# Patient Record
Sex: Female | Born: 1965 | Race: White | Hispanic: No | Marital: Married | Smoking: Never smoker
Health system: Southern US, Community
[De-identification: ages and names within clinical notes are randomized; demographics above are authoritative.]

## PROBLEM LIST (undated history)

## (undated) DIAGNOSIS — E079 Disorder of thyroid, unspecified: Secondary | ICD-10-CM

## (undated) HISTORY — PX: TUBAL LIGATION: SHX77

## (undated) HISTORY — PX: OTHER SURGICAL HISTORY: SHX169

---

## 2007-06-18 ENCOUNTER — Ambulatory Visit: Payer: Self-pay | Admitting: Gastroenterology

## 2008-08-13 ENCOUNTER — Ambulatory Visit: Payer: Self-pay | Admitting: Obstetrics and Gynecology

## 2008-10-09 ENCOUNTER — Ambulatory Visit: Payer: Self-pay | Admitting: Obstetrics and Gynecology

## 2011-03-04 ENCOUNTER — Ambulatory Visit: Payer: Self-pay

## 2011-03-04 LAB — RAPID STREP-A WITH REFLX: Micro Text Report: NEGATIVE

## 2011-05-21 IMAGING — US ULTRASOUND RIGHT BREAST
1 series · 17 of 20 positions shown · non-contrast
Comparison: None.

REASON FOR EXAM: right breast asymmetry Hilifavali [id number]
[id number]
COMMENTS:

PROCEDURE:     US  - US BREAST RIGHT  - October 09, 2008 [DATE]
RESULT:

[Series 1: ultrasound right breast · 17 of 20 slices shown]
[im 1/20]
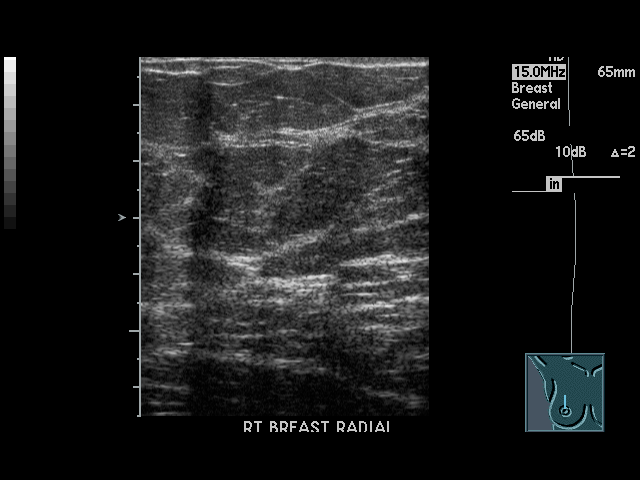
[im 2/20]
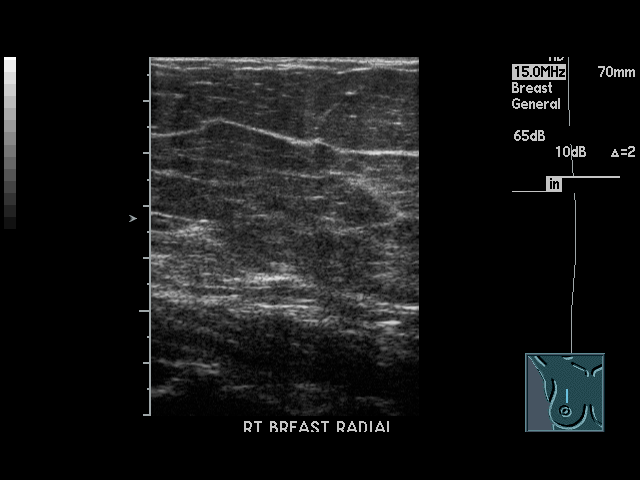
[im 3/20]
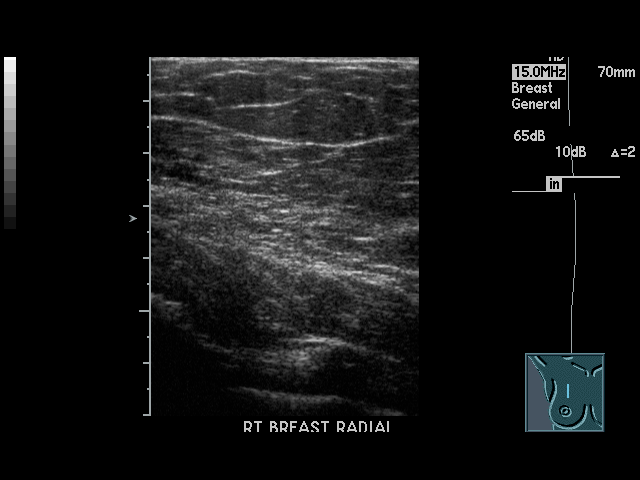
[im 5/20]
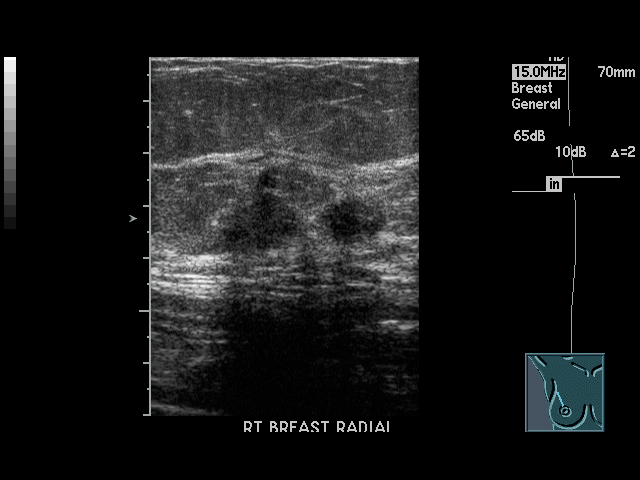
[im 6/20]
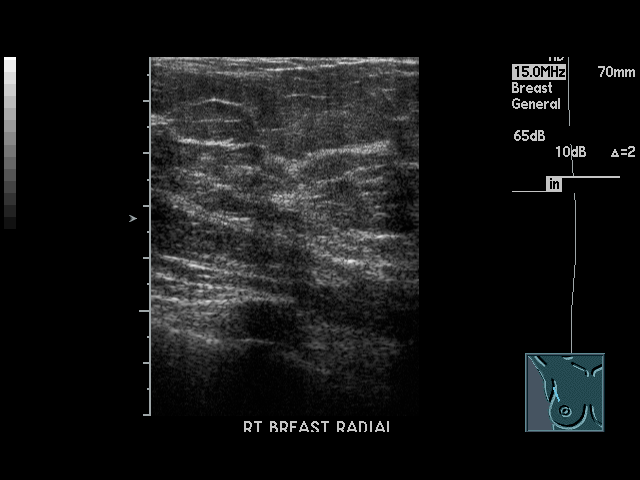
[im 7/20]
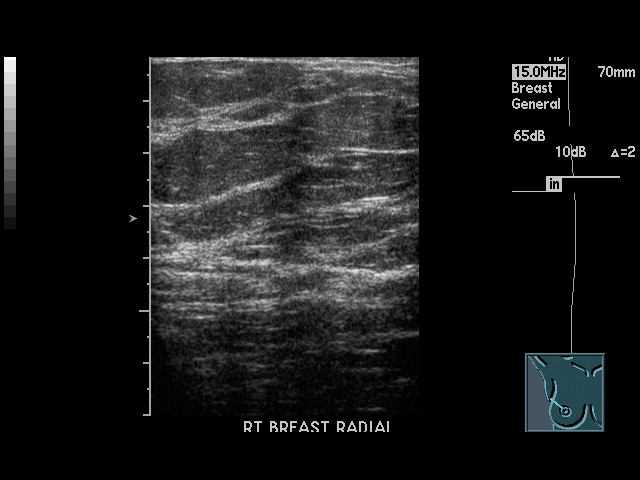
[im 8/20]
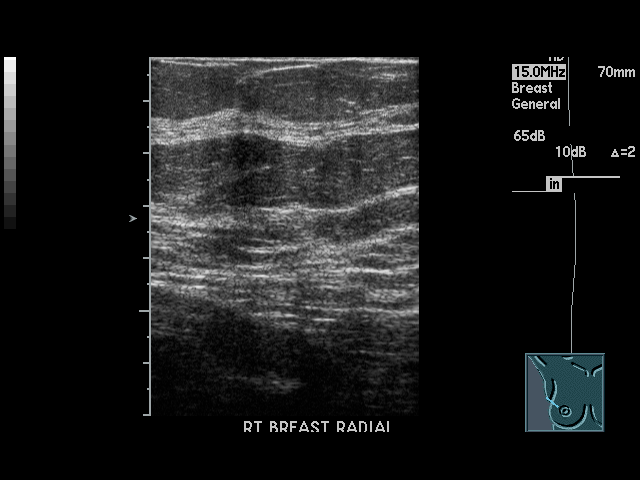
[im 9/20]
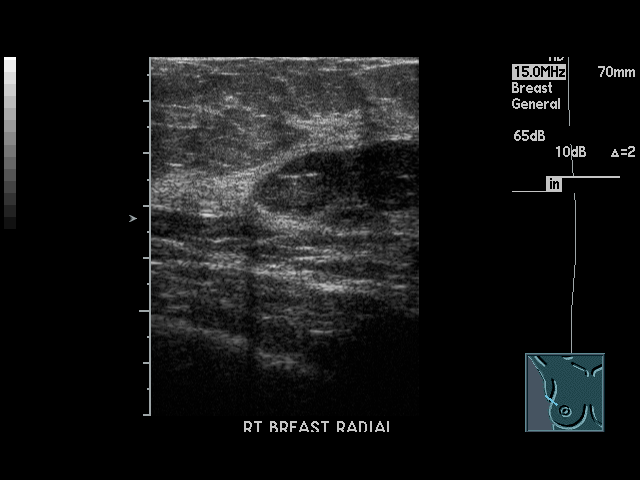
[im 11/20]
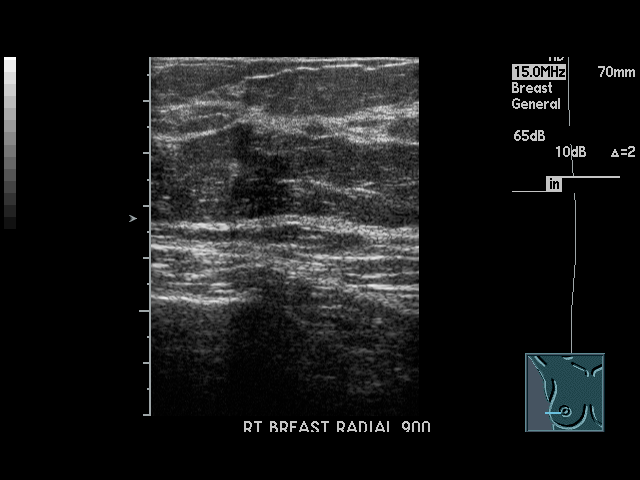
[im 12/20]
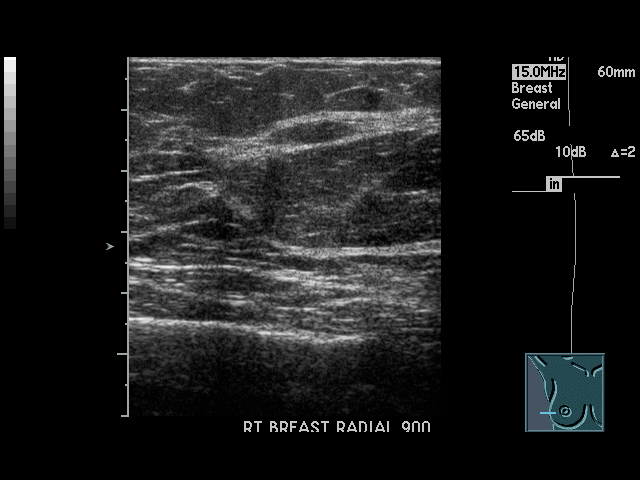
[im 13/20]
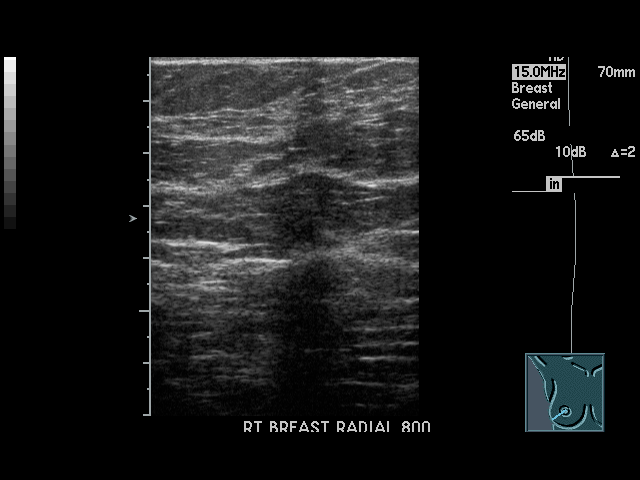
[im 14/20]
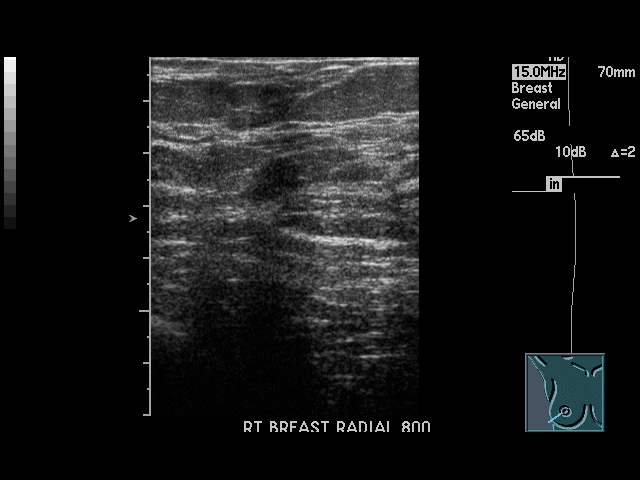
[im 15/20]
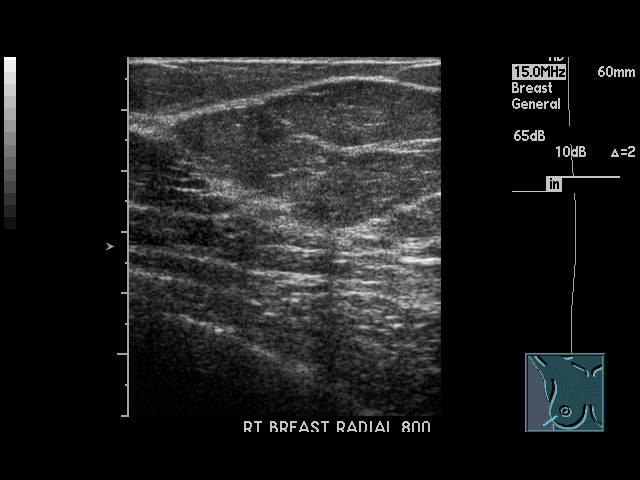
[im 16/20]
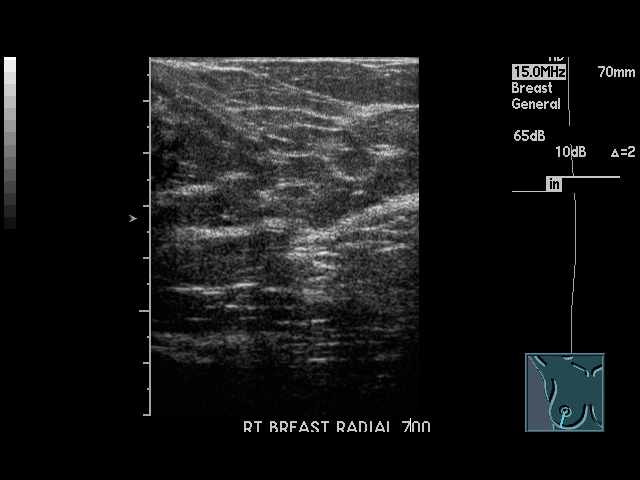
[im 18/20]
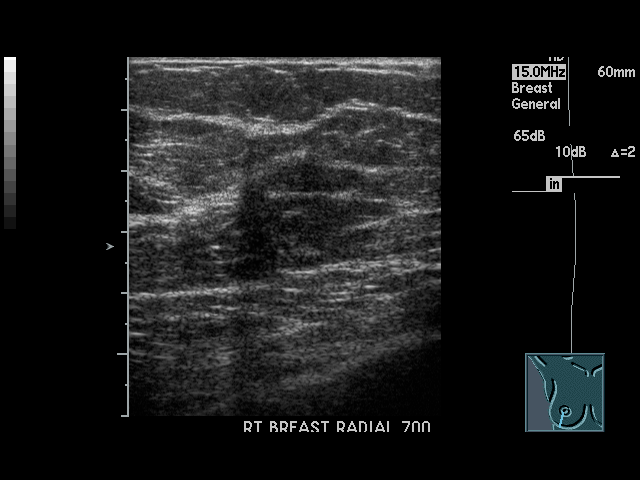
[im 19/20]
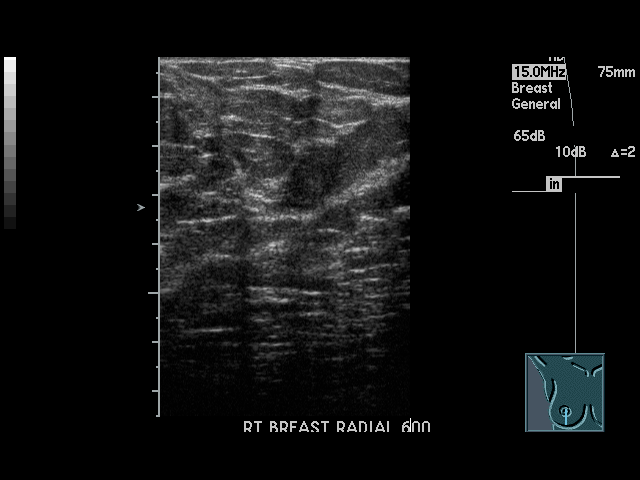
[im 20/20]
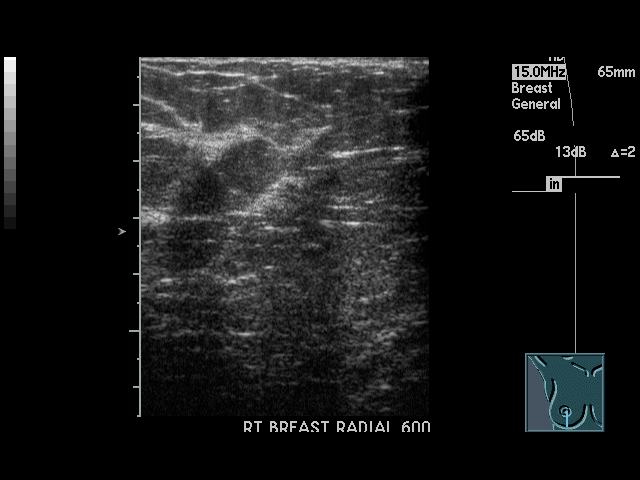

[17 of 20 positions shown; findings below may reference images not displayed]

FINDINGS: True lateral view of the right breast and spot compression views
of the upper outer right breast were performed.  The area of concern spreads
out and partially blends in with adjacent fibroglandular tissue.  There is
no dominant mass. There is no cluster of suspicious microcalcifications.

On the true lateral view there is no focal abnormality.

Further evaluation was performed with real time sonography of the lateral
half of the right breast.  There is no solid or cystic mass visualized.
There is no architectural distortion.
IMPRESSION: 1.Previously described focal asymmetry in the upper outer right breast
demonstrates no sonographic abnormality.  On spot compression views the area
compresses and partially blends in with normal adjacent fibroglandular
tissue.  No further evaluation is recommended. Return to annual mammographic
follow-up is recommended.

BI-RADS: Category 2 - Benign Findings

## 2013-10-24 ENCOUNTER — Emergency Department: Payer: Self-pay | Admitting: Emergency Medicine

## 2014-05-03 ENCOUNTER — Ambulatory Visit
Admit: 2014-05-03 | Disposition: A | Discharge status: Home / Self Care | Payer: Self-pay | Attending: Family Medicine | Admitting: Family Medicine

## 2014-05-03 LAB — URINALYSIS, COMPLETE
Bacteria: NEGATIVE
Bilirubin,UR: NEGATIVE
Blood: NEGATIVE
Glucose,UR: NEGATIVE
Ketone: NEGATIVE
LEUKOCYTE ESTERASE: NEGATIVE
Nitrite: NEGATIVE
PH: 5.5 (ref 5.0–8.0)
PROTEIN: NEGATIVE
Specific Gravity: 1.005 (ref 1.000–1.030)
WBC UR: NONE SEEN /HPF (ref 0–5)

## 2014-05-05 LAB — URINE CULTURE

## 2016-06-04 IMAGING — CR DG FOOT COMPLETE 3+V*L*
1 series · 3 of 3 positions shown · non-contrast
Comparison: None.

CLINICAL DATA: Stepped on a nail. Foot tenderness. Initial
encounter.

EXAM:
LEFT FOOT - COMPLETE 3+ VIEW

[Series 1: x foot ap left · 0.14mm/px · 3 of 3 slices shown]
[im 1/3]
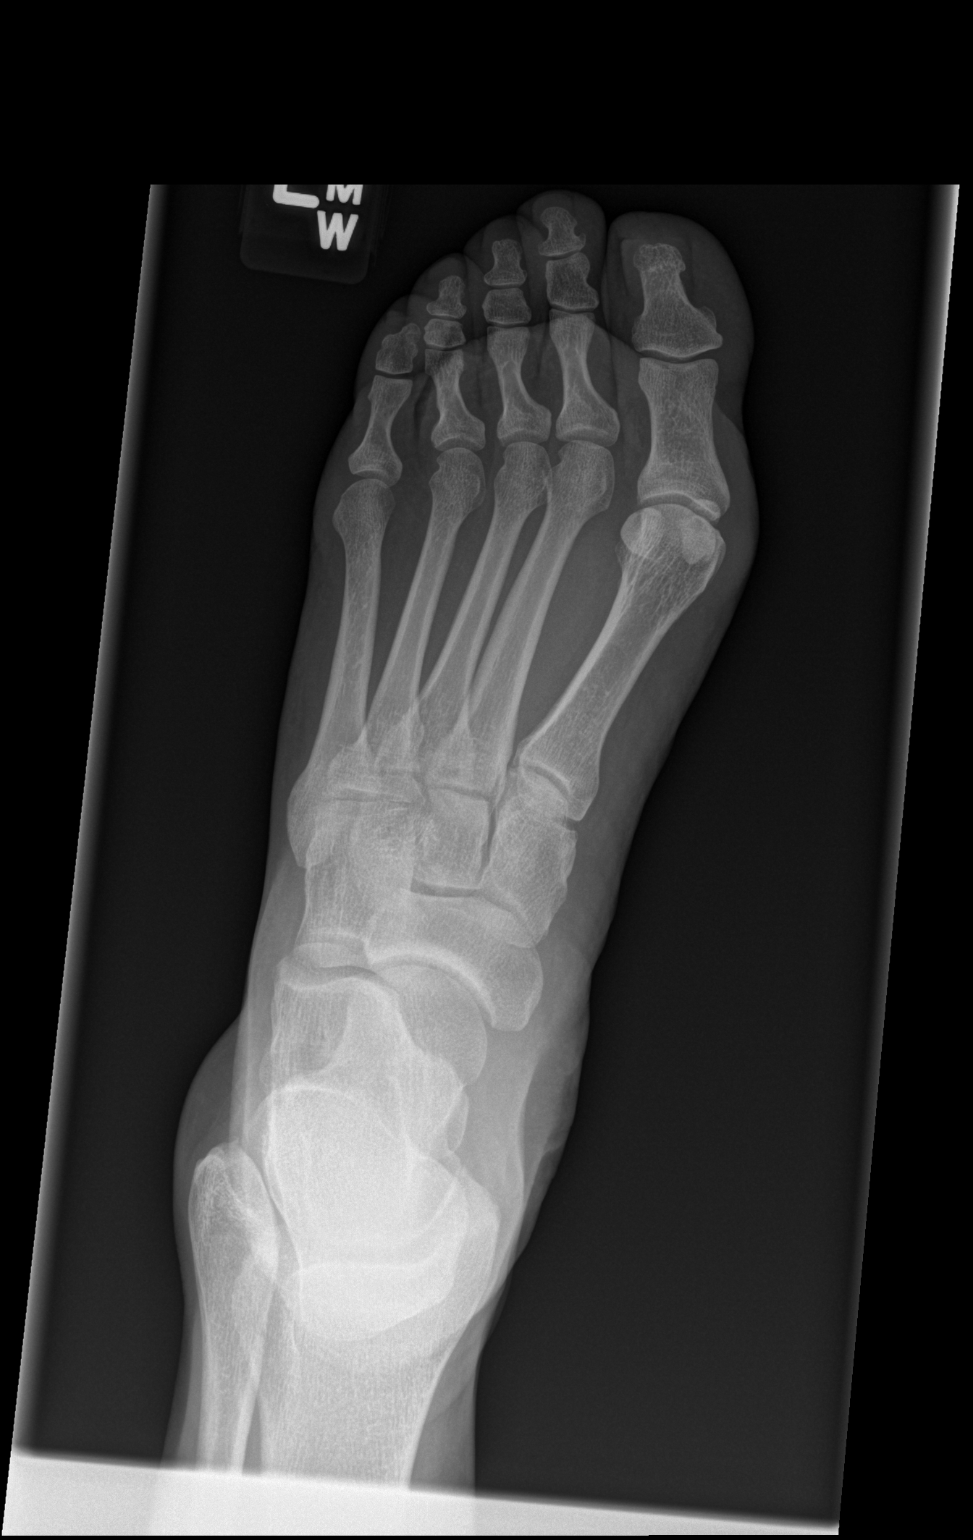
[im 2/3]
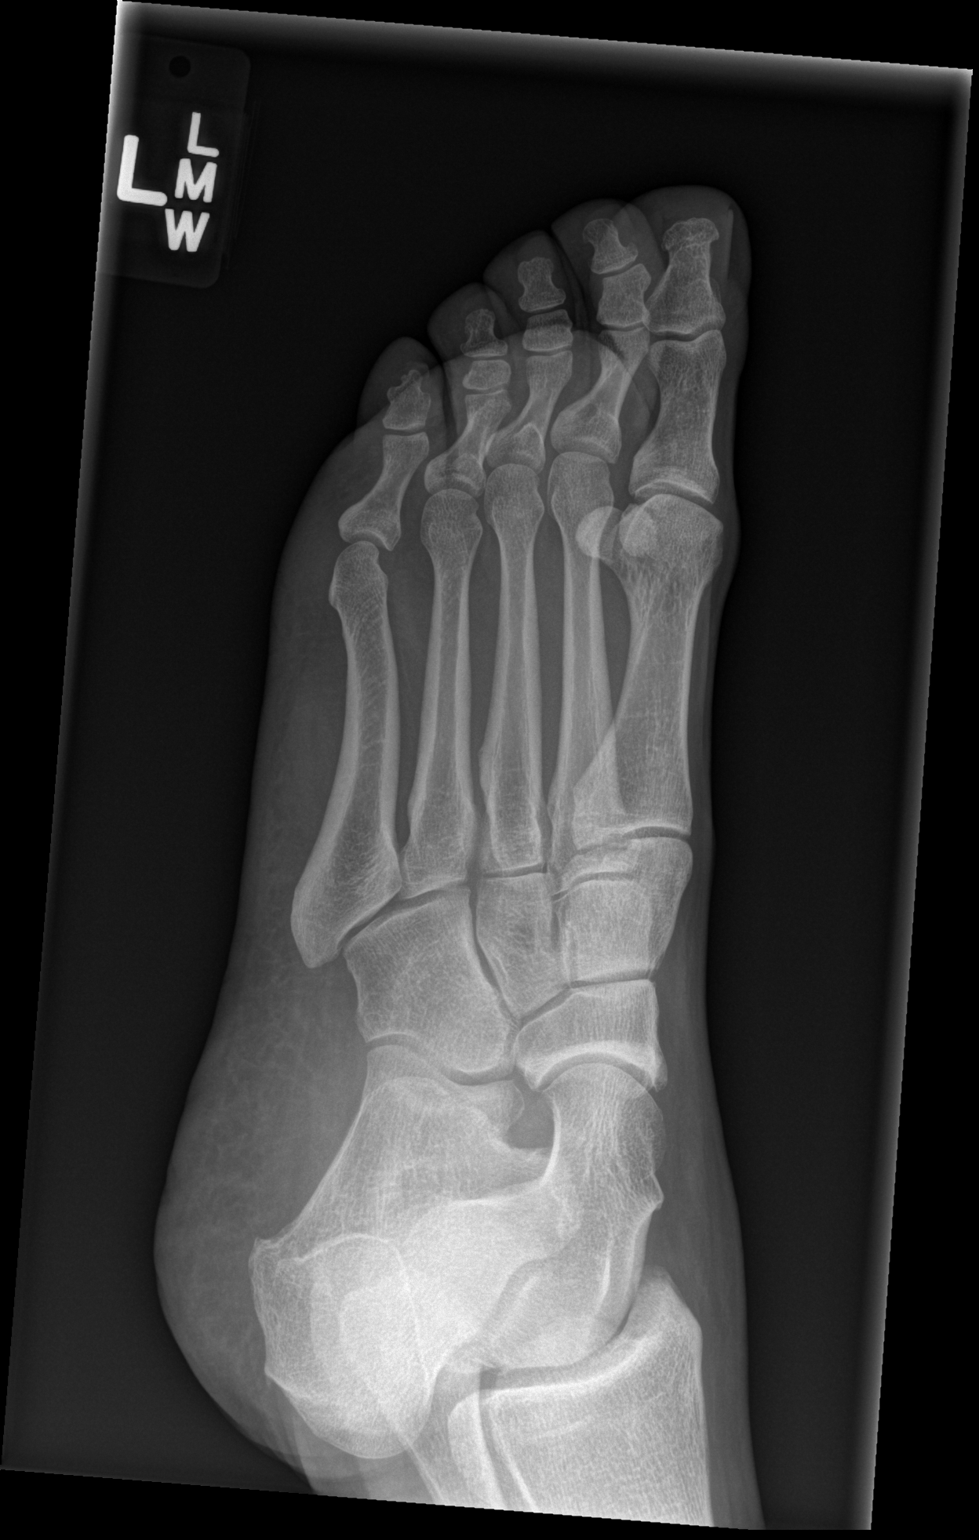
[im 3/3]
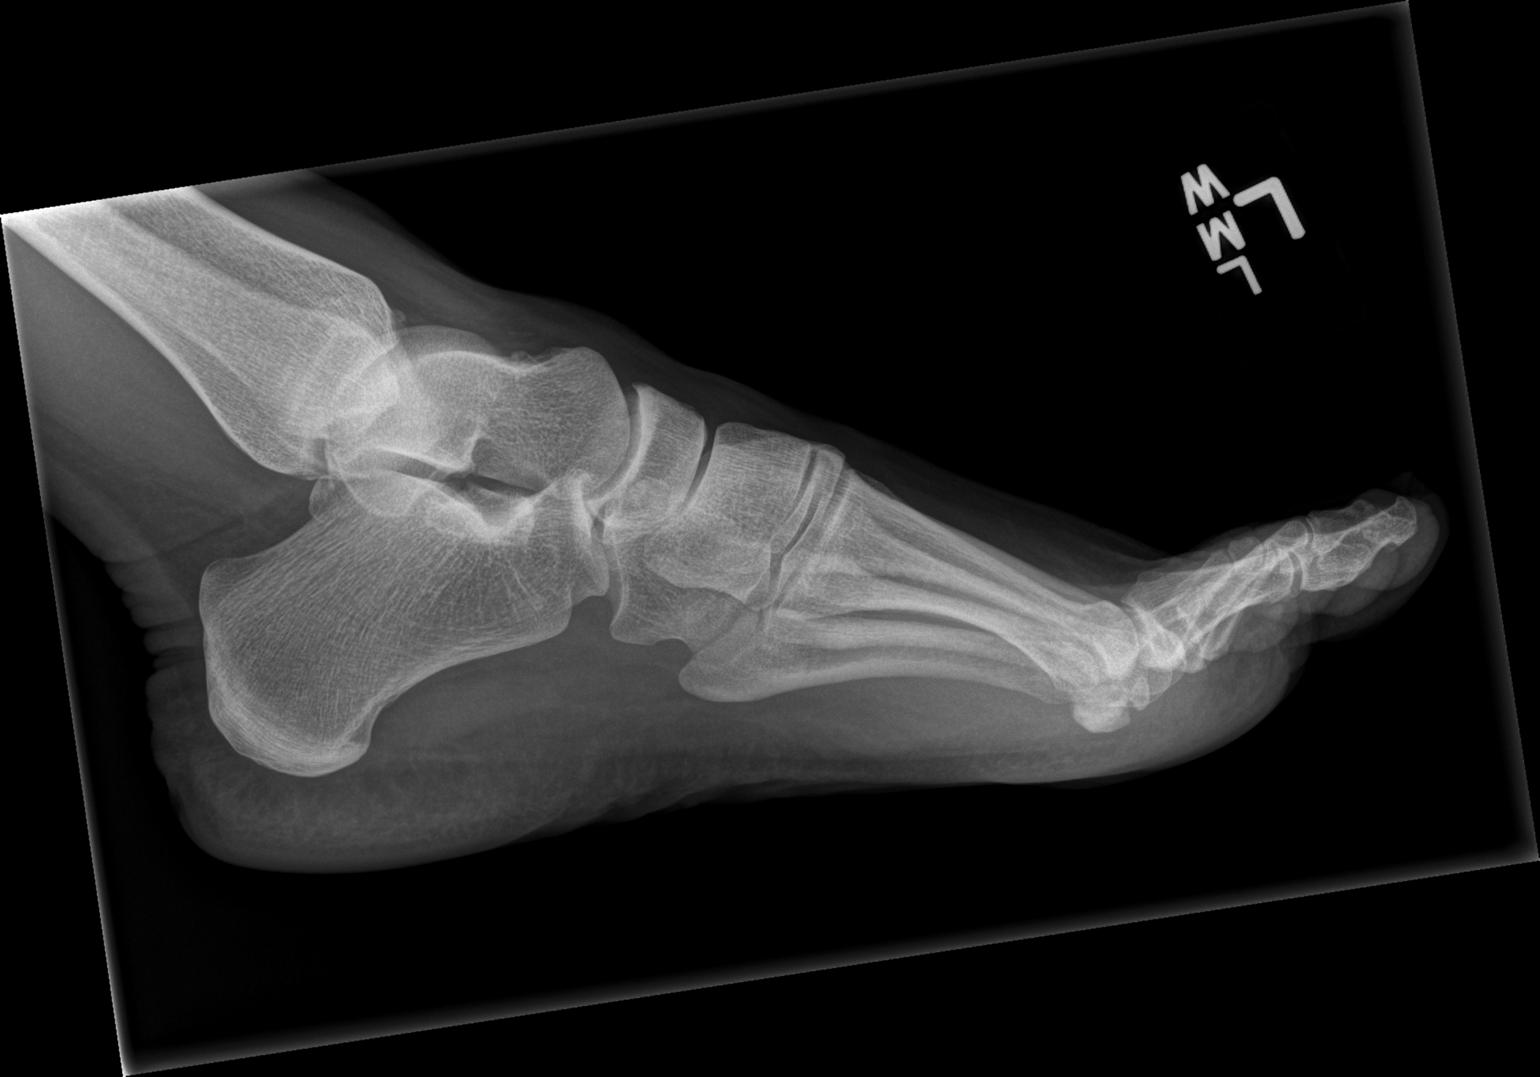

[3 of 3 positions shown; findings below may reference images not displayed]

FINDINGS: There is no evidence of fracture or dislocation. No metallic foreign
body. No subcutaneous gas.
IMPRESSION: No fracture or metallic foreign body.

## 2016-12-22 ENCOUNTER — Other Ambulatory Visit: Payer: Self-pay | Admitting: Obstetrics and Gynecology

## 2016-12-22 DIAGNOSIS — Z1231 Encounter for screening mammogram for malignant neoplasm of breast: Secondary | ICD-10-CM

## 2017-01-18 ENCOUNTER — Inpatient Hospital Stay: Admission: RE | Admit: 2017-01-18 | Payer: Self-pay | Source: Ambulatory Visit

## 2017-11-29 ENCOUNTER — Ambulatory Visit
Admission: EM | Admit: 2017-11-29 | Discharge: 2017-11-29 | Disposition: A | Discharge status: Home / Self Care | Payer: BC Managed Care – PPO | Attending: Family Medicine | Admitting: Family Medicine

## 2017-11-29 ENCOUNTER — Other Ambulatory Visit: Payer: Self-pay

## 2017-11-29 ENCOUNTER — Ambulatory Visit (INDEPENDENT_AMBULATORY_CARE_PROVIDER_SITE_OTHER)
Admit: 2017-11-29 | Discharge: 2017-11-29 | Disposition: A | Discharge status: Home / Self Care | Payer: BC Managed Care – PPO | Attending: Emergency Medicine | Admitting: Emergency Medicine

## 2017-11-29 DIAGNOSIS — R110 Nausea: Secondary | ICD-10-CM | POA: Diagnosis not present

## 2017-11-29 DIAGNOSIS — M5416 Radiculopathy, lumbar region: Secondary | ICD-10-CM | POA: Insufficient documentation

## 2017-11-29 DIAGNOSIS — K760 Fatty (change of) liver, not elsewhere classified: Secondary | ICD-10-CM | POA: Diagnosis not present

## 2017-11-29 DIAGNOSIS — R194 Change in bowel habit: Secondary | ICD-10-CM | POA: Diagnosis not present

## 2017-11-29 DIAGNOSIS — R208 Other disturbances of skin sensation: Secondary | ICD-10-CM | POA: Diagnosis not present

## 2017-11-29 DIAGNOSIS — E079 Disorder of thyroid, unspecified: Secondary | ICD-10-CM | POA: Diagnosis not present

## 2017-11-29 DIAGNOSIS — R1032 Left lower quadrant pain: Secondary | ICD-10-CM | POA: Insufficient documentation

## 2017-11-29 DIAGNOSIS — R109 Unspecified abdominal pain: Secondary | ICD-10-CM | POA: Diagnosis not present

## 2017-11-29 DIAGNOSIS — R509 Fever, unspecified: Secondary | ICD-10-CM

## 2017-11-29 DIAGNOSIS — M545 Low back pain: Secondary | ICD-10-CM | POA: Diagnosis present

## 2017-11-29 HISTORY — DX: Disorder of thyroid, unspecified: E07.9

## 2017-11-29 LAB — CBC WITH DIFFERENTIAL/PLATELET
Abs Immature Granulocytes: 0.02 10*3/uL (ref 0.00–0.07)
Basophils Absolute: 0.1 10*3/uL (ref 0.0–0.1)
Basophils Relative: 1 %
Eosinophils Absolute: 0.1 10*3/uL (ref 0.0–0.5)
Eosinophils Relative: 1 %
HCT: 41.3 % (ref 36.0–46.0)
Hemoglobin: 13.7 g/dL (ref 12.0–15.0)
Immature Granulocytes: 0 %
Lymphocytes Relative: 35 %
Lymphs Abs: 2.8 10*3/uL (ref 0.7–4.0)
MCH: 31.7 pg (ref 26.0–34.0)
MCHC: 33.2 g/dL (ref 30.0–36.0)
MCV: 95.6 fL (ref 80.0–100.0)
Monocytes Absolute: 0.8 10*3/uL (ref 0.1–1.0)
Monocytes Relative: 10 %
Neutro Abs: 4.3 10*3/uL (ref 1.7–7.7)
Neutrophils Relative %: 53 %
Platelets: 322 10*3/uL (ref 150–400)
RBC: 4.32 MIL/uL (ref 3.87–5.11)
RDW: 13.1 % (ref 11.5–15.5)
WBC: 8 10*3/uL (ref 4.0–10.5)
nRBC: 0 % (ref 0.0–0.2)

## 2017-11-29 LAB — URINALYSIS, COMPLETE (UACMP) WITH MICROSCOPIC
BACTERIA UA: NONE SEEN
BILIRUBIN URINE: NEGATIVE
GLUCOSE, UA: NEGATIVE mg/dL
KETONES UR: NEGATIVE mg/dL
LEUKOCYTES UA: NEGATIVE
Nitrite: NEGATIVE
PH: 5.5 (ref 5.0–8.0)
Protein, ur: NEGATIVE mg/dL
Specific Gravity, Urine: 1.025 (ref 1.005–1.030)

## 2017-11-29 LAB — WET PREP, GENITAL
Clue Cells Wet Prep HPF POC: NONE SEEN
Sperm: NONE SEEN
Trich, Wet Prep: NONE SEEN
Yeast Wet Prep HPF POC: NONE SEEN

## 2017-11-29 LAB — COMPREHENSIVE METABOLIC PANEL
ALT: 21 U/L (ref 0–44)
AST: 26 U/L (ref 15–41)
Albumin: 4.3 g/dL (ref 3.5–5.0)
Alkaline Phosphatase: 83 U/L (ref 38–126)
Anion gap: 9 (ref 5–15)
BUN: 17 mg/dL (ref 6–20)
CO2: 25 mmol/L (ref 22–32)
Calcium: 9.2 mg/dL (ref 8.9–10.3)
Chloride: 103 mmol/L (ref 98–111)
Creatinine, Ser: 0.92 mg/dL (ref 0.44–1.00)
GFR calc Af Amer: >60 mL/min (ref >60)
GFR calc non Af Amer: >60 mL/min (ref >60)
Glucose, Bld: 100 mg/dL — ABNORMAL HIGH (ref 70–99)
Potassium: 3.7 mmol/L (ref 3.5–5.1)
Sodium: 137 mmol/L (ref 135–145)
Total Bilirubin: 0.5 mg/dL (ref 0.3–1.2)
Total Protein: 8.2 g/dL — ABNORMAL HIGH (ref 6.5–8.1)

## 2017-11-29 MED ORDER — METAXALONE 800 MG PO TABS: 800.0000 mg | ORAL_TABLET | Freq: Three times a day (TID) | ORAL | 0 refills | Status: DC

## 2017-11-29 MED ORDER — MELOXICAM 15 MG PO TABS: 15.0000 mg | ORAL_TABLET | Freq: Every day | ORAL | 0 refills | Status: DC

## 2017-11-29 MED ORDER — IOPAMIDOL (ISOVUE-300) INJECTION 61%
100.0000 mL | Freq: Once | INTRAVENOUS | Status: AC | PRN
  Administered 2017-11-29: 100 mL via INTRAVENOUS

## 2017-11-29 MED ORDER — KETOCONAZOLE 2 % EX CREA: 1.0000 "application " | TOPICAL_CREAM | Freq: Every day | CUTANEOUS | 0 refills | Status: AC

## 2017-11-29 NOTE — ED Notes (Signed)
Authorization code [id number]

## 2017-11-29 NOTE — Discharge Instructions (Signed)
Avoid symptoms as much as possible. Use  Caution while taking muscle relaxers.  Do not perform activities requiring concentration or judgment and do not drive.

## 2017-11-29 NOTE — ED Triage Notes (Signed)
Pt with a couple weeks of low back pain bilaterally and pain into left lower quad abdominal pain. Low grade fever. Sx worse last night. Some burning in genitalia.

## 2017-11-29 NOTE — ED Provider Notes (Signed)
[facility]    CSN: [id number] Arrival date & time: 11/29/17  4098     History   Chief Complaint Chief Complaint  Patient presents with  . Back Pain  . Abdominal Pain    HPI Jennifer Phillips is a 52 y.o. female.   HPI  52 year old female who presents with a 2-week history of bilateral low back pain indicating the L4 level  Radiating into her left lower quadrant.  She  had a low-grade fever with nausea and change in her bowel movements with frequent's soft formed stools but without blood or mucus.  States that she had worsening symptoms since last night, particularly the left lower abdominal pain.  Relates a burning sensation and itching of her labia.  She has recently been on azithromycin for sinus infection and has 1 more day of medication.  Currently is on her fourth day of Diflucan normally takes 5 days after antibiotic use.  She states is not unusual for her to develop a vaginitis or yeast infection while on antibiotics. Never Had a kidney stone in the past.          Past Medical History:  Diagnosis Date  . Thyroid disease     There are no active problems to display for this patient.   Past Surgical History:  Procedure Laterality Date  . CESAREAN SECTION    . TUBAL LIGATION    . uterine ablation      OB History   None      Home Medications    Prior to Admission medications   Medication Sig Start Date End Date Taking? Authorizing Provider  fluconazole (DIFLUCAN) 150 MG tablet  02/01/17  Yes [provider]  acetaminophen (TYLENOL) 500 MG tablet Take by mouth.    [provider]  Cholecalciferol (VITAMIN D-1000 MAX ST) 25 MCG (1000 UT) tablet Take by mouth.    [provider]  ketoconazole (NIZORAL) 2 % cream Apply 1 application topically daily. 11/29/17   Lutricia Feil, PA-C  meloxicam (MOBIC) 15 MG tablet Take 1 tablet (15 mg total) by mouth daily. 11/29/17   Lutricia Feil, PA-C  metaxalone (SKELAXIN) 800  MG tablet Take 1 tablet (800 mg total) by mouth 3 (three) times daily. 11/29/17   Lutricia Feil, PA-C    Family History History reviewed. No pertinent family history.  Social History Social History   Tobacco Use  . Smoking status: Never Smoker  . Smokeless tobacco: Never Used  Substance Use Topics  . Alcohol use: Yes    Comment: rare  . Drug use: Never     Allergies   Penicillins   Review of Systems Review of Systems  Constitutional: Positive for activity change, chills and fever. Negative for fatigue.  Gastrointestinal: Positive for abdominal pain, diarrhea and nausea. Negative for abdominal distention, anal bleeding, blood in stool, constipation and vomiting.  Genitourinary: Positive for flank pain and vaginal discharge.  Musculoskeletal: Positive for back pain.  All other systems reviewed and are negative.    Physical Exam Triage Vital Signs ED Triage Vitals  Enc Vitals Group     BP 11/29/17 0848 (!) 162/95     Pulse Rate 11/29/17 0848 66     Resp 11/29/17 0848 18     Temp 11/29/17 0848 98.2 F (36.8 C)     Temp Source 11/29/17 0848 Oral     SpO2 11/29/17 0848 100 %     Weight 11/29/17 0849 217 lb (98.4 kg)  Height 11/29/17 0849 5\' 7"  (1.702 m)     Head Circumference --      Peak Flow --      Pain Score 11/29/17 0849 7     Pain Loc --      Pain Edu? --      Excl. in GC? --    No data found.  Updated Vital Signs BP (!) 162/95 (BP Location: Right Arm)   Pulse 66   Temp 98.2 F (36.8 C) (Oral)   Resp 18   Ht 5\' 7"  (1.702 m)   Wt 217 lb (98.4 kg)   SpO2 100%   BMI 33.99 kg/m   Visual Acuity Right Eye Distance:   Left Eye Distance:   Bilateral Distance:    Right Eye Near:   Left Eye Near:    Bilateral Near:     Physical Exam  Constitutional: She is oriented to person, place, and time. She appears well-developed and well-nourished.  Non-toxic appearance. She does not appear ill. No distress.  HENT:  Head: Normocephalic.  Eyes: Pupils  are equal, round, and reactive to light.  Pulmonary/Chest: Effort normal and breath sounds normal.  Abdominal: Soft. Normal appearance. Bowel sounds are decreased. There is no hepatosplenomegaly. There is tenderness in the left lower quadrant. There is no rigidity, no rebound, no guarding, no CVA tenderness, no tenderness at McBurney's point and negative Murphy's sign.  Genitourinary:  Genitourinary Comments: Patient performed a self swab  Neurological: She is alert and oriented to person, place, and time.  Skin: Skin is warm and dry.  Psychiatric: She has a normal mood and affect. Her behavior is normal.  Nursing note and vitals reviewed.    UC Treatments / Results  Labs (all labs ordered are listed, but only abnormal results are displayed) Labs Reviewed  WET PREP, GENITAL - Abnormal; Notable for the following components:      Result Value   WBC, Wet Prep HPF POC FEW (*)    All other components within normal limits  URINALYSIS, COMPLETE (UACMP) WITH MICROSCOPIC - Abnormal; Notable for the following components:   Color, Urine STRAW (*)    APPearance HAZY (*)    Hgb urine dipstick MODERATE (*)    All other components within normal limits  COMPREHENSIVE METABOLIC PANEL - Abnormal; Notable for the following components:   Glucose, Bld 100 (*)    Total Protein 8.2 (*)    All other components within normal limits  URINE CULTURE  CBC WITH DIFFERENTIAL/PLATELET    EKG None  Radiology Ct Abdomen Pelvis W Contrast  Result Date: 11/29/2017 CLINICAL DATA:  Lower abdominal pain and fever EXAM: CT ABDOMEN AND PELVIS WITH CONTRAST TECHNIQUE: Multidetector CT imaging of the abdomen and pelvis was performed using the standard protocol following bolus administration of intravenous contrast. CONTRAST:  ISOVUE-300 IOPAMIDOL (ISOVUE-300) INJECTION 61% COMPARISON:  None. FINDINGS: Lower chest: There is mild atelectatic change in the posterior right base. There is no lung base edema or  consolidation. Hepatobiliary: There is hepatic steatosis. No focal liver lesions are appreciable. Gallbladder wall is not appreciably thickened. There is no evident biliary duct dilatation. Pancreas: There is no pancreatic mass or inflammatory focus. Spleen: No splenic lesions are evident. Adrenals/Urinary Tract: Adrenals appear unremarkable. There are small parapelvic cysts in each kidney, subcentimeter. There is no evident hydronephrosis on either side. There is no renal or ureteral calculus on either side. Urinary bladder is midline with wall thickness within normal limits. Stomach/Bowel: There is no appreciable bowel  wall or mesenteric thickening. No evident bowel obstruction. There is no free air or portal venous air. Vascular/Lymphatic: There is no abdominal aortic aneurysm. No vascular lesions are appreciable. There is no adenopathy evident in the abdomen or pelvis. Reproductive: The uterus is anteverted. No pelvic masses appreciable. Other: Appendix region appears normal. No abscess or ascites is evident in abdomen or pelvis. Musculoskeletal: There is degenerative change in the lower lumbar spine with disc narrowing and vacuum phenomenon noted at L4-5. There are no blastic or lytic bone lesions. There is no intramuscular or abdominal wall lesion evident. IMPRESSION: 1. No evident bowel obstruction. No abscess in the abdomen or pelvis. No findings suggesting diverticulitis. Appendix region appears normal. 2. No evident renal or ureteral calculus. No hydronephrosis. Urinary bladder wall thickness is normal. 3.  Hepatic steatosis. 4. Degenerative change noted in the lumbar spine, most notably at L4-5. Electronically Signed   By: Bretta BangWilliam  Woodruff III M.D.   On: 11/29/2017 11:13    Procedures Procedures (including critical care time)  Medications Ordered in UC Medications - No data to display  Initial Impression / Assessment and Plan / UC Course  I have reviewed the triage vital signs and the nursing  notes.  Pertinent labs & imaging results that were available during my care of the patient were reviewed by me and considered in my medical decision making (see chart for details).     I reviewed the CT results with the patient.  No evidence of diverticulitis nor of kidney stones.  There is a finding of degenerative changes at the L4-5 level which may be radiating into her left lower quadrant.  Explain the fevers that she has been explaining although she was afebrile in our office.  At this point I have told her we will address the back pain with the radiation from the possible decreased disc height and a radiculopathy.  Start her on Mobic 15 mg daily as well as a muscle relaxer with appropriate cautions.  She worsens I have told her to go to the emergency room and have recommended that she follow-up with her primary care physician in a week or 2. Final Clinical Impressions(s) / UC Diagnoses   Final diagnoses:  Acute left lumbar radiculopathy     Discharge Instructions     Avoid symptoms as much as possible. Use  Caution while taking muscle relaxers.  Do not perform activities requiring concentration or judgment and do not drive.     ED Prescriptions    Medication Sig Dispense Auth. Provider   meloxicam (MOBIC) 15 MG tablet Take 1 tablet (15 mg total) by mouth daily. 30 tablet Lutricia FeilRoemer, Niharika Savino P, PA-C   metaxalone (SKELAXIN) 800 MG tablet Take 1 tablet (800 mg total) by mouth 3 (three) times daily. 21 tablet Ovid Curdoemer, Daden Mahany P, PA-C   ketoconazole (NIZORAL) 2 % cream Apply 1 application topically daily. 15 g Lutricia Feiloemer, Laquonda Welby P, PA-C     Controlled Substance Prescriptions [place] Controlled Substance Registry consulted? Not Applicable   Lutricia FeilRoemer, Ector Laurel P, PA-C 11/29/17 1538

## 2017-11-30 ENCOUNTER — Ambulatory Visit: Payer: BC Managed Care – PPO

## 2017-11-30 LAB — URINE CULTURE: Culture: <10000 — AB

## 2018-02-19 ENCOUNTER — Other Ambulatory Visit: Payer: Self-pay

## 2018-02-19 ENCOUNTER — Ambulatory Visit
Admission: EM | Admit: 2018-02-19 | Discharge: 2018-02-19 | Disposition: A | Discharge status: Home / Self Care | Payer: BC Managed Care – PPO | Attending: Family Medicine | Admitting: Family Medicine

## 2018-02-19 DIAGNOSIS — J0100 Acute maxillary sinusitis, unspecified: Secondary | ICD-10-CM

## 2018-02-19 MED ORDER — DOXYCYCLINE HYCLATE 100 MG PO TABS: 100.0000 mg | ORAL_TABLET | Freq: Two times a day (BID) | ORAL | 0 refills | Status: DC

## 2018-02-19 NOTE — ED Provider Notes (Signed)
[facility]    CSN: [id number] Arrival date & time: 02/19/18  1446     History   Chief Complaint Chief Complaint  Patient presents with  . Headache    HPI Jennifer Phillips is a 53 y.o. female.   The history is provided by the patient.  Headache  Associated symptoms: congestion, ear pain, facial pain, fatigue, fever and URI   URI  Presenting symptoms: congestion, ear pain, facial pain, fatigue, fever and rhinorrhea   Severity:  Moderate Onset quality:  Sudden Duration:  7 days Timing:  Constant Progression:  Worsening Chronicity:  New Relieved by:  Nothing Ineffective treatments:  OTC medications Associated symptoms: headaches   Risk factors: sick contacts     Past Medical History:  Diagnosis Date  . Thyroid disease     There are no active problems to display for this patient.   Past Surgical History:  Procedure Laterality Date  . CESAREAN SECTION    . TUBAL LIGATION    . uterine ablation      OB History   No obstetric history on file.      Home Medications    Prior to Admission medications   Medication Sig Start Date End Date Taking? Authorizing Provider  acetaminophen (TYLENOL) 500 MG tablet Take by mouth.   Yes [provider]  Cholecalciferol (VITAMIN D-1000 MAX ST) 25 MCG (1000 UT) tablet Take by mouth.   Yes [provider]  levothyroxine (SYNTHROID, LEVOTHROID) 112 MCG tablet Take 112 mcg by mouth daily. 10/13/17  Yes [provider]  doxycycline (VIBRA-TABS) 100 MG tablet Take 1 tablet (100 mg total) by mouth 2 (two) times daily. 02/19/18   [name], MD  fluconazole (DIFLUCAN) 150 MG tablet  02/01/17   [provider]  ketoconazole (NIZORAL) 2 % cream Apply 1 application topically daily. 11/29/17   Lutricia Feil, PA-C  meloxicam (MOBIC) 15 MG tablet Take 1 tablet (15 mg total) by mouth daily. 11/29/17   Lutricia Feil, PA-C  metaxalone (SKELAXIN) 800 MG tablet Take 1 tablet (800 mg  total) by mouth 3 (three) times daily. 11/29/17   Lutricia Feil, PA-C    Family History History reviewed. No pertinent family history.  Social History Social History   Tobacco Use  . Smoking status: Never Smoker  . Smokeless tobacco: Never Used  Substance Use Topics  . Alcohol use: Yes    Comment: rare  . Drug use: Never     Allergies   Penicillins   Review of Systems Review of Systems  Constitutional: Positive for fatigue and fever.  HENT: Positive for congestion, ear pain and rhinorrhea.   Neurological: Positive for headaches.     Physical Exam Triage Vital Signs ED Triage Vitals  Enc Vitals Group     BP 02/19/18 1520 (!) 147/86     Pulse Rate 02/19/18 1520 78     Resp 02/19/18 1520 18     Temp 02/19/18 1520 98.4 F (36.9 C)     Temp Source 02/19/18 1520 Oral     SpO2 02/19/18 1520 100 %     Weight 02/19/18 1519 226 lb (102.5 kg)     Height 02/19/18 1519 5\' 7"  (1.702 m)     Head Circumference --      Peak Flow --      Pain Score 02/19/18 1518 8     Pain Loc --      Pain Edu? --      Excl.  in GC? --    No data found.  Updated Vital Signs BP (!) 147/86 (BP Location: Left Arm)   Pulse 78   Temp 98.4 F (36.9 C) (Oral)   Resp 18   Ht 5\' 7"  (1.702 m)   Wt 102.5 kg   SpO2 100%   BMI 35.40 kg/m   Visual Acuity Right Eye Distance:   Left Eye Distance:   Bilateral Distance:    Right Eye Near:   Left Eye Near:    Bilateral Near:     Physical Exam Vitals signs and nursing note reviewed.  Constitutional:      General: She is not in acute distress.    Appearance: She is well-developed. She is not diaphoretic.  HENT:     Head: Normocephalic and atraumatic.     Right Ear: Tympanic membrane, ear canal and external ear normal.     Left Ear: Tympanic membrane, ear canal and external ear normal.     Nose:     Right Sinus: Maxillary sinus tenderness and frontal sinus tenderness present.     Left Sinus: Maxillary sinus tenderness and frontal sinus  tenderness present.     Mouth/Throat:     Pharynx: Uvula midline. No oropharyngeal exudate.  Eyes:     General: No scleral icterus.       Right eye: No discharge.        Left eye: No discharge.     Conjunctiva/sclera: Conjunctivae normal.     Pupils: Pupils are equal, round, and reactive to light.  Neck:     Musculoskeletal: Normal range of motion and neck supple.     Thyroid: No thyromegaly.  Cardiovascular:     Rate and Rhythm: Normal rate and regular rhythm.     Heart sounds: Normal heart sounds.  Pulmonary:     Effort: Pulmonary effort is normal. No respiratory distress.     Breath sounds: Normal breath sounds. No wheezing or rales.  Lymphadenopathy:     Cervical: No cervical adenopathy.      UC Treatments / Results  Labs (all labs ordered are listed, but only abnormal results are displayed) Labs Reviewed - No data to display  EKG None  Radiology No results found.  Procedures Procedures (including critical care time)  Medications Ordered in UC Medications - No data to display  Initial Impression / Assessment and Plan / UC Course  I have reviewed the triage vital signs and the nursing notes.  Pertinent labs & imaging results that were available during my care of the patient were reviewed by me and considered in my medical decision making (see chart for details).      Final Clinical Impressions(s) / UC Diagnoses   Final diagnoses:  Acute maxillary sinusitis, recurrence not specified    ED Prescriptions    Medication Sig Dispense Auth. Provider   doxycycline (VIBRA-TABS) 100 MG tablet Take 1 tablet (100 mg total) by mouth 2 (two) times daily. 20 tablet [name], MD     1. diagnosis reviewed with patient 2. rx as per orders above; reviewed possible side effects, interactions, risks and benefits  3. Recommend supportive treatment with otc flonase 4. Follow-up prn if symptoms worsen or don't improve  Controlled Substance Prescriptions [place]  Controlled Substance Registry consulted? Not Applicable   [name], MD 02/19/18 1610

## 2018-02-19 NOTE — ED Triage Notes (Signed)
Patient complains of headache that started yesterday. Patient states that over the weekend she started running a fever and was fatigued. Patient states that her entire head hurts.

## 2018-03-24 ENCOUNTER — Ambulatory Visit
Admission: EM | Admit: 2018-03-24 | Discharge: 2018-03-24 | Disposition: A | Discharge status: Home / Self Care | Payer: BC Managed Care – PPO | Attending: Family Medicine | Admitting: Family Medicine

## 2018-03-24 ENCOUNTER — Other Ambulatory Visit: Payer: Self-pay

## 2018-03-24 ENCOUNTER — Encounter: Payer: Self-pay | Admitting: Emergency Medicine

## 2018-03-24 DIAGNOSIS — R05 Cough: Secondary | ICD-10-CM

## 2018-03-24 DIAGNOSIS — J019 Acute sinusitis, unspecified: Secondary | ICD-10-CM | POA: Diagnosis not present

## 2018-03-24 MED ORDER — HYDROCODONE-HOMATROPINE 5-1.5 MG/5ML PO SYRP: 5.0000 mL | ORAL_SOLUTION | Freq: Four times a day (QID) | ORAL | 0 refills | Status: DC | PRN

## 2018-03-24 MED ORDER — CEFDINIR 300 MG PO CAPS: 300.0000 mg | ORAL_CAPSULE | Freq: Two times a day (BID) | ORAL | 0 refills | Status: DC

## 2018-03-24 NOTE — ED Triage Notes (Signed)
Patient c/o cough, ear pain, and sinus congestion for 6 weeks.  Patient reports low grade fevers.

## 2018-03-24 NOTE — Discharge Instructions (Signed)
Medication as directed.  If persists, I recommend you see ENT.  Take care  Dr. Adriana Simas

## 2018-03-24 NOTE — ED Provider Notes (Signed)
[facility]    CSN: [id number] Arrival date & time: 03/24/18  1504  History   Chief Complaint Chief Complaint  Patient presents with  . Cough   HPI  53 year old female presents with multiple complaints.  Patient states that she has been sick for 6 weeks.  Patient was treated for sinusitis with doxycycline on 2/11.  Patient states that her symptoms improved but then recurred again.  Patient states that she has also seen her primary.  Patient states that she continues to feel poorly.  She has missed work and missed a bridal shower.  States that this is atypical for her.  She reports cough, ear pain, left-sided sinus pain/pressure/congestion.  Reports headache.  Also reports that she is very irritable.  Patient states that she does not feel well.  She also endorses neck pain.  Cough is persistent as well.  Improves with Mucinex D.  No other medications or interventions tried. No other complaints.  History reviewed and updated as below.  Past Medical History:  Diagnosis Date  . Thyroid disease    Past Surgical History:  Procedure Laterality Date  . CESAREAN SECTION    . TUBAL LIGATION    . uterine ablation      OB History   No obstetric history on file.    Home Medications    Prior to Admission medications   Medication Sig Start Date End Date Taking? Authorizing Provider  Cholecalciferol (VITAMIN D-1000 MAX ST) 25 MCG (1000 UT) tablet Take by mouth.   Yes [provider]  levothyroxine (SYNTHROID, LEVOTHROID) 112 MCG tablet Take 112 mcg by mouth daily. 10/13/17  Yes [provider]  acetaminophen (TYLENOL) 500 MG tablet Take by mouth.    [provider]  cefdinir (OMNICEF) 300 MG capsule Take 1 capsule (300 mg total) by mouth 2 (two) times daily. 03/24/18   Tommie Sams, DO  fluconazole (DIFLUCAN) 150 MG tablet  02/01/17   [provider]  HYDROcodone-homatropine (HYCODAN) 5-1.5 MG/5ML syrup Take 5 mLs by mouth every 6 (six) hours  as needed. 03/24/18   Tommie Sams, DO  ketoconazole (NIZORAL) 2 % cream Apply 1 application topically daily. 11/29/17   Lutricia Feil, PA-C  meloxicam (MOBIC) 15 MG tablet Take 1 tablet (15 mg total) by mouth daily. 11/29/17   Lutricia Feil, PA-C  metaxalone (SKELAXIN) 800 MG tablet Take 1 tablet (800 mg total) by mouth 3 (three) times daily. 11/29/17   Lutricia Feil, PA-C   Social History Social History   Tobacco Use  . Smoking status: Never Smoker  . Smokeless tobacco: Never Used  Substance Use Topics  . Alcohol use: Yes    Comment: rare  . Drug use: Never     Allergies   Penicillins   Review of Systems Review of Systems Per HPI  Physical Exam Triage Vital Signs ED Triage Vitals  Enc Vitals Group     BP 03/24/18 1519 (!) 153/98     Pulse Rate 03/24/18 1519 80     Resp 03/24/18 1519 16     Temp 03/24/18 1519 98.5 F (36.9 C)     Temp Source 03/24/18 1519 Oral     SpO2 03/24/18 1519 99 %     Weight 03/24/18 1517 216 lb (98 kg)     Height 03/24/18 1517 5\' 7"  (1.702 m)     Head Circumference --      Peak Flow --      Pain Score 03/24/18  1517 8     Pain Loc --      Pain Edu? --      Excl. in GC? --    Updated Vital Signs BP (!) 153/98 (BP Location: Left Arm)   Pulse 80   Temp 98.5 F (36.9 C) (Oral)   Resp 16   Ht 5\' 7"  (1.702 m)   Wt 98 kg   SpO2 99%   BMI 33.83 kg/m   Visual Acuity Right Eye Distance:   Left Eye Distance:   Bilateral Distance:    Right Eye Near:   Left Eye Near:    Bilateral Near:     Physical Exam Vitals signs and nursing note reviewed.  Constitutional:      General: She is not in acute distress.    Appearance: Normal appearance. She is obese.  HENT:     Head: Normocephalic and atraumatic.     Comments: Mild maxillary sinus tenderness to palpation.    Right Ear: Tympanic membrane normal.     Left Ear: Tympanic membrane normal.     Mouth/Throat:     Pharynx: Oropharynx is clear. No oropharyngeal exudate.   Eyes:     General:        Right eye: No discharge.        Left eye: No discharge.     Conjunctiva/sclera: Conjunctivae normal.  Cardiovascular:     Rate and Rhythm: Normal rate and regular rhythm.  Pulmonary:     Effort: Pulmonary effort is normal.     Breath sounds: Normal breath sounds.  Neurological:     Mental Status: She is alert.  Psychiatric:        Behavior: Behavior normal.     Comments: Irritable.     UC Treatments / Results  Labs (all labs ordered are listed, but only abnormal results are displayed) Labs Reviewed - No data to display  EKG None  Radiology No results found.  Procedures Procedures (including critical care time)  Medications Ordered in UC Medications - No data to display  Initial Impression / Assessment and Plan / UC Course  I have reviewed the triage vital signs and the nursing notes.  Pertinent labs & imaging results that were available during my care of the patient were reviewed by me and considered in my medical decision making (see chart for details).    53 year old female presents with sinusitis.  Treated with Omnicef.  Hycodan for cough.  Final Clinical Impressions(s) / UC Diagnoses   Final diagnoses:  Subacute sinusitis, unspecified location     Discharge Instructions     Medication as directed.  If persists, I recommend you see ENT.  Take care  Dr. Adriana Simas    ED Prescriptions    Medication Sig Dispense Auth. Provider   cefdinir (OMNICEF) 300 MG capsule Take 1 capsule (300 mg total) by mouth 2 (two) times daily. 20 capsule Itha Kroeker G, DO   HYDROcodone-homatropine (HYCODAN) 5-1.5 MG/5ML syrup Take 5 mLs by mouth every 6 (six) hours as needed. 120 mL Tommie Sams, DO     Controlled Substance Prescriptions [place] Controlled Substance Registry consulted? Not Applicable   Tommie Sams, DO 03/24/18 1657

## 2021-07-11 ENCOUNTER — Ambulatory Visit
Admission: EM | Admit: 2021-07-11 | Discharge: 2021-07-11 | Disposition: A | Discharge status: Home / Self Care | Payer: Self-pay | Attending: Physician Assistant | Admitting: Physician Assistant

## 2021-07-11 DIAGNOSIS — I10 Essential (primary) hypertension: Secondary | ICD-10-CM | POA: Insufficient documentation

## 2021-07-11 DIAGNOSIS — M545 Low back pain, unspecified: Secondary | ICD-10-CM | POA: Insufficient documentation

## 2021-07-11 DIAGNOSIS — B3731 Acute candidiasis of vulva and vagina: Secondary | ICD-10-CM | POA: Insufficient documentation

## 2021-07-11 DIAGNOSIS — G8929 Other chronic pain: Secondary | ICD-10-CM | POA: Insufficient documentation

## 2021-07-11 LAB — WET PREP, GENITAL
Clue Cells Wet Prep HPF POC: NONE SEEN
Sperm: NONE SEEN
Trich, Wet Prep: NONE SEEN
WBC, Wet Prep HPF POC: <10 — AB (ref <10)

## 2021-07-11 LAB — URINALYSIS, ROUTINE W REFLEX MICROSCOPIC
Bilirubin Urine: NEGATIVE
Glucose, UA: NEGATIVE mg/dL
Hgb urine dipstick: NEGATIVE
Ketones, ur: NEGATIVE mg/dL
Leukocytes,Ua: NEGATIVE
Nitrite: NEGATIVE
Protein, ur: NEGATIVE mg/dL
Specific Gravity, Urine: 1.02 (ref 1.005–1.030)
pH: 5.5 (ref 5.0–8.0)

## 2021-07-11 MED ORDER — NAPROXEN 500 MG PO TABS: 500.0000 mg | ORAL_TABLET | Freq: Two times a day (BID) | ORAL | 0 refills | Status: AC | PRN

## 2021-07-11 MED ORDER — FLUCONAZOLE 150 MG PO TABS: 150.0000 mg | ORAL_TABLET | Freq: Once | ORAL | 0 refills | Status: AC

## 2021-07-11 MED ORDER — BACLOFEN 10 MG PO TABS: 10.0000 mg | ORAL_TABLET | Freq: Three times a day (TID) | ORAL | 0 refills | Status: DC | PRN

## 2021-07-11 NOTE — Discharge Instructions (Addendum)
YEAST INFECTION: You do not have a UTI but you do have a yeast infection so I sent Diflucan to the pharmacy.  Increase rest and fluid intake.  I believe your back pain is not due to UTI and well likely related to your chronic condition.    Keep your follow-up appointment with the chiropractor if you have believe that is helpful.  I have sent naproxen which is an anti-inflammatory medicine and baclofen which is a muscle relaxer to the pharmacy.  BACK PAIN: Stressed avoiding painful activities . RICE (REST, ICE, COMPRESSION, ELEVATION) guidelines reviewed. May alternate ice and heat. Consider use of muscle rubs, Salonpas patches, etc. Use medications as directed including muscle relaxers if prescribed. Take anti-inflammatory medications as prescribed or OTC NSAIDs/Tylenol.  F/u with PCP in 7-10 days for reexamination, and please feel free to call or return to the urgent care at any time for any questions or concerns you may have and we will be happy to help you!   BACK PAIN RED FLAGS: If the back pain acutely worsens or there are any red flag symptoms such as numbness/tingling, leg weakness, saddle anesthesia, or loss of bowel/bladder control, go immediately to the ER. Follow up with [place] as scheduled or sooner if the pain does not begin to resolve or if it worsens before the follow up    HYPERTENSION: BP is very elevated today.  You need to make an appointment with your primary care provider to discuss getting back on a blood pressure medicine that you tolerate better than the HCTZ.  For now you should definitely make sure you are exercising/walking daily and watching her diet.
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
## 2021-07-11 NOTE — ED Triage Notes (Addendum)
Patient presents to UC for Cramps in her side, Frequent urination, flank pain, and burning with urination -- started about 4-5 weeks ago.   She went though "[facility]" and they gave her some medication (ciprofloxacin) but that didn't "take care of it."

## 2021-08-05 ENCOUNTER — Ambulatory Visit
Admission: EM | Admit: 2021-08-05 | Discharge: 2021-08-05 | Disposition: A | Discharge status: Home / Self Care | Payer: Self-pay

## 2021-08-05 DIAGNOSIS — R21 Rash and other nonspecific skin eruption: Secondary | ICD-10-CM

## 2021-08-05 MED ORDER — PREDNISONE 20 MG PO TABS: ORAL_TABLET | ORAL | 0 refills | Status: AC

## 2021-08-05 MED ORDER — PREDNISONE 20 MG PO TABS: ORAL_TABLET | ORAL | 0 refills | Status: DC

## 2021-08-05 NOTE — ED Triage Notes (Signed)
Patient presents to [facility] for a rash since Wednesday.  Patient has been using benadryl with no help.  She reports that it has spread all over and it is itching.   Patient reports that her hydrochlorothiazide was increased recently.

## 2021-08-05 NOTE — ED Provider Notes (Addendum)
[facility]    CSN: [id number] Arrival date & time: 08/05/21  1740      History   Chief Complaint Chief Complaint  Patient presents with   Rash    HPI Jennifer Phillips is a 56 y.o. female.   HPI  She has a rash that started in her groin and has spread up to her chest on her arms and legs for 2 days. She has used a new detergent but is unsure if this is the cause. She has had some shortness of breath but feels like it is related to heat. She has used OTC topical agents without relief.  Denies fever, chills, headache, dizziness, visual changes, shortness of breath, dyspnea on exertion, chest pain, nausea, vomiting, constipation, diarrhea, or any edema.    Past Medical History:  Diagnosis Date   Thyroid disease     There are no problems to display for this patient.   Past Surgical History:  Procedure Laterality Date   CESAREAN SECTION     TUBAL LIGATION     uterine ablation      OB History   No obstetric history on file.      Home Medications    Prior to Admission medications   Medication Sig Start Date End Date Taking? Authorizing Provider  acetaminophen (TYLENOL) 500 MG tablet Take by mouth.   Yes [provider]  baclofen (LIORESAL) 10 MG tablet Take 1 tablet (10 mg total) by mouth 3 (three) times daily as needed for muscle spasms. 07/11/21  Yes Shirlee Latch, PA-C  Cholecalciferol (VITAMIN D-1000 MAX ST) 25 MCG (1000 UT) tablet Take by mouth.   Yes [provider]  hydrochlorothiazide (HYDRODIURIL) 50 MG tablet Take 50 mg by mouth daily. 08/02/21  Yes [provider]  ketoconazole (NIZORAL) 2 % cream Apply 1 application topically daily. 11/29/17  Yes Lutricia Feil, PA-C  levothyroxine (SYNTHROID, LEVOTHROID) 112 MCG tablet Take 112 mcg by mouth daily. 10/13/17  Yes [provider]  SUMAtriptan (IMITREX) 25 MG tablet Take 25 mg by mouth every 2 (two) hours as needed. 02/21/21  Yes [provider]   predniSONE (DELTASONE) 20 MG tablet Take 3 tablets (60 mg total) by mouth daily with breakfast for 3 days, THEN 2 tablets (40 mg total) daily with breakfast for 3 days, THEN 1 tablet (20 mg total) daily with breakfast for 3 days. 08/05/21 08/14/21  Barbette Merino, NP    Family History History reviewed. No pertinent family history.  Social History Social History   Tobacco Use   Smoking status: Never   Smokeless tobacco: Never  Vaping Use   Vaping Use: Never used  Substance Use Topics   Alcohol use: Yes    Comment: rare   Drug use: Never     Allergies   Penicillins   Review of Systems Review of Systems   Physical Exam Triage Vital Signs ED Triage Vitals  Enc Vitals Group     BP 08/05/21 1806 139/90     Pulse Rate 08/05/21 1806 70     Resp --      Temp 08/05/21 1806 98.6 F (37 C)     Temp Source 08/05/21 1806 Oral     SpO2 08/05/21 1806 93 %     Weight 08/05/21 1803 236 lb (107 kg)     Height 08/05/21 1803 5\' 7"  (1.702 m)     Head Circumference --      Peak Flow --  Pain Score 08/05/21 1803 10     Pain Loc --      Pain Edu? --      Excl. in GC? --    No data found.  Updated Vital Signs BP 139/90 (BP Location: Left Arm)   Pulse 70   Temp 98.6 F (37 C) (Oral)   Ht 5\' 7"  (1.702 m)   Wt 236 lb (107 kg)   SpO2 93%   BMI 36.96 kg/m   Visual Acuity Right Eye Distance:   Left Eye Distance:   Bilateral Distance:    Right Eye Near:   Left Eye Near:    Bilateral Near:     Physical Exam Constitutional:      General: She is not in acute distress.    Appearance: She is obese. She is not toxic-appearing or diaphoretic.  HENT:     Head: Normocephalic and atraumatic.     Nose: Nose normal.     Mouth/Throat:     Mouth: Mucous membranes are moist.  Cardiovascular:     Rate and Rhythm: Normal rate.  Pulmonary:     Effort: Pulmonary effort is normal.     Breath sounds: Normal breath sounds.  Musculoskeletal:        General: Normal range of motion.   Skin:    General: Skin is warm.     Capillary Refill: Capillary refill takes less than 2 seconds.     Coloration: Skin is not jaundiced.     Findings: Erythema and rash present. No bruising or lesion.  Neurological:     General: No focal deficit present.     Mental Status: She is alert and oriented to person, place, and time.  Psychiatric:        Mood and Affect: Mood normal.        Behavior: Behavior normal.      UC Treatments / Results  Labs (all labs ordered are listed, but only abnormal results are displayed) Labs Reviewed - No data to display  EKG   Radiology No results found.  Procedures Procedures (including critical care time)  Medications Ordered in UC Medications - No data to display  Initial Impression / Assessment and Plan / UC Course  I have reviewed the triage vital signs and the nursing notes.  Pertinent labs & imaging results that were available during my care of the patient were reviewed by me and considered in my medical decision making (see chart for details).    Rash vs allergic reaction   Final Clinical Impressions(s) / UC Diagnoses   Final diagnoses:  Rash     Discharge Instructions      Prednisone Dosepak as directed Use caution with foods and all contacts (clothing, linens etc)     ED Prescriptions     Medication Sig Dispense Auth. Provider   predniSONE (DELTASONE) 20 MG tablet  (Status: Discontinued) Take 3 tablets (60 mg total) by mouth daily with breakfast for 3 days, THEN 2 tablets (40 mg total) daily with breakfast for 3 days, THEN 1 tablet (20 mg total) daily with breakfast for 3 days. 18 tablet M, NP   predniSONE (DELTASONE) 20 MG tablet Take 3 tablets (60 mg total) by mouth daily with breakfast for 3 days, THEN 2 tablets (40 mg total) daily with breakfast for 3 days, THEN 1 tablet (20 mg total) daily with breakfast for 3 days. 18 tablet Thad Ranger, NP      PDMP not reviewed this encounter.  Barbette Merino, NP 08/05/21 1852    Barbette Merino, NP 08/05/21 [phone]

## 2021-08-05 NOTE — Discharge Instructions (Addendum)
Prednisone Dosepak as directed Use caution with foods and all contacts (clothing, linens etc)

## 2022-02-20 ENCOUNTER — Ambulatory Visit (INDEPENDENT_AMBULATORY_CARE_PROVIDER_SITE_OTHER): Payer: Self-pay

## 2022-02-20 ENCOUNTER — Ambulatory Visit
Admission: EM | Admit: 2022-02-20 | Discharge: 2022-02-20 | Disposition: A | Discharge status: Home / Self Care | Payer: Self-pay | Attending: Physician Assistant | Admitting: Physician Assistant

## 2022-02-20 DIAGNOSIS — M25562 Pain in left knee: Secondary | ICD-10-CM

## 2022-02-20 DIAGNOSIS — G8929 Other chronic pain: Secondary | ICD-10-CM

## 2022-02-20 DIAGNOSIS — M5442 Lumbago with sciatica, left side: Secondary | ICD-10-CM

## 2022-02-20 MED ORDER — TIZANIDINE HCL 4 MG PO TABS: 4.0000 mg | ORAL_TABLET | Freq: Three times a day (TID) | ORAL | 0 refills | Status: AC | PRN

## 2022-02-20 MED ORDER — PREDNISONE 10 MG PO TABS: ORAL_TABLET | ORAL | 0 refills | Status: AC

## 2022-02-20 NOTE — Discharge Instructions (Signed)

## 2022-02-20 NOTE — ED Triage Notes (Addendum)
Pt c/o LT knee pain x3 months, pt states she fell asleep one night with legs propped up. Pt states now feeling tightness that she feels in LT lower back

## 2022-02-20 NOTE — ED Provider Notes (Signed)
[facility]    CSN: [id number] Arrival date & time: 02/20/22  1551      History   Chief Complaint Chief Complaint  Patient presents with   Knee Pain    LT knee    HPI Jennifer Phillips is a 57 y.o. female with history of hypertension, migraines, thyroid disease, chronic low back pain. Patient presents today for concerns about 3 month history of left medial knee pain.  Also reports pain of the posterior left thigh, lateral left leg and into the left buttocks and left lower back.  Increased pain in her knee and leg with walking and weightbearing.  Increased back pain with movements. She does see a chiropractor/massage therapist for her chronic back pain. Takes BCs and Tylenol for her back pain which generally helps.  She says she has also tried baclofen.  Additionally tried prednisone 20 mg tablets over the past couple days and says that it did seem to help.  She says the prednisone seems to be the only thing that has actually helped.  She denies any sort of injury to her knee.  She is convinced the pain is related to when she fell asleep with her legs propped up for couple of hours.  No leg weakness, numbness/tingling, loss of bowel or bladder control.  HPI  Past Medical History:  Diagnosis Date   Thyroid disease     There are no problems to display for this patient.   Past Surgical History:  Procedure Laterality Date   CESAREAN SECTION     TUBAL LIGATION     uterine ablation      OB History   No obstetric history on file.      Home Medications    Prior to Admission medications   Medication Sig Start Date End Date Taking? Authorizing Provider  hydrochlorothiazide (HYDRODIURIL) 50 MG tablet Take 50 mg by mouth daily. 08/02/21  Yes [provider]  predniSONE (DELTASONE) 10 MG tablet Take 6 tabs p.o. on day 1 and use 1 tablet daily until complete 02/20/22  Yes Laurene Footman B, PA-C  SUMAtriptan (IMITREX) 25 MG tablet Take 25 mg by mouth every 2 (two)  hours as needed. 02/21/21  Yes [provider]  tiZANidine (ZANAFLEX) 4 MG tablet Take 1 tablet (4 mg total) by mouth every 8 (eight) hours as needed for up to 7 days. 02/20/22 02/27/22 Yes Danton Clap, PA-C  acetaminophen (TYLENOL) 500 MG tablet Take by mouth.    [provider]  Cholecalciferol (VITAMIN D-1000 MAX ST) 25 MCG (1000 UT) tablet Take by mouth.    [provider]  ketoconazole (NIZORAL) 2 % cream Apply 1 application topically daily. 11/29/17   Lorin Picket, PA-C  levothyroxine (SYNTHROID, LEVOTHROID) 112 MCG tablet Take 112 mcg by mouth daily. 10/13/17   [provider]    Family History History reviewed. No pertinent family history.  Social History Social History   Tobacco Use   Smoking status: Never   Smokeless tobacco: Never  Vaping Use   Vaping Use: Never used  Substance Use Topics   Alcohol use: Yes    Comment: rare   Drug use: Never     Allergies   Penicillins   Review of Systems Review of Systems  Gastrointestinal:  Negative for abdominal pain, nausea and vomiting.  Genitourinary:  Negative for difficulty urinating, dysuria and flank pain.  Musculoskeletal:  Positive for arthralgias and back pain (chronic). Negative for gait problem and joint swelling.  Skin:  Negative for color change and wound.  Neurological:  Negative for weakness and numbness.     Physical Exam Triage Vital Signs ED Triage Vitals  Enc Vitals Group     BP --      Pulse Rate 02/20/22 1724 84     Resp --      Temp 02/20/22 1724 98.5 F (36.9 C)     Temp Source 02/20/22 1723 Oral     SpO2 02/20/22 1724 97 %     Weight 02/20/22 1723 242 lb (109.8 kg)     Height 02/20/22 1722 5' 7"$  (1.702 m)     Head Circumference --      Peak Flow --      Pain Score 02/20/22 1723 10     Pain Loc --      Pain Edu? --      Excl. in [place]? --    No data found.  Updated Vital Signs BP (!) 153/88   Pulse 84   Temp 98.5 F (36.9 C)   Ht 5' 7"$  (1.702  m)   Wt 242 lb (109.8 kg)   SpO2 97%   BMI 37.90 kg/m   Physical Exam Vitals and nursing note reviewed.  Constitutional:      General: She is not in acute distress.    Appearance: Normal appearance. She is not ill-appearing or toxic-appearing.  HENT:     Head: Normocephalic and atraumatic.  Eyes:     General: No scleral icterus.       Right eye: No discharge.        Left eye: No discharge.     Conjunctiva/sclera: Conjunctivae normal.  Cardiovascular:     Rate and Rhythm: Normal rate and regular rhythm.     Heart sounds: Normal heart sounds.  Pulmonary:     Effort: Pulmonary effort is normal. No respiratory distress.     Breath sounds: Normal breath sounds.  Musculoskeletal:     Cervical back: Neck supple.     Lumbar back: Tenderness (left paralumbar muscles, left buttocks, left posterior upper leg) present. Decreased range of motion. Positive right straight leg raise test and positive left straight leg raise test.     Left knee: No effusion or ecchymosis. Normal range of motion. No tenderness.  Skin:    General: Skin is dry.  Neurological:     General: No focal deficit present.     Mental Status: She is alert. Mental status is at baseline.     Motor: No weakness.     Gait: Gait normal.  Psychiatric:        Mood and Affect: Mood normal.        Behavior: Behavior normal.        Thought Content: Thought content normal.      UC Treatments / Results  Labs (all labs ordered are listed, but only abnormal results are displayed) Labs Reviewed - No data to display  EKG   Radiology DG Knee Complete 4 Views Left  Result Date: 02/20/2022 CLINICAL DATA:  Atraumatic knee pain for 3 months. EXAM: LEFT KNEE - COMPLETE 4+ VIEW COMPARISON:  None Available. FINDINGS: No evidence of fracture, dislocation, or joint effusion. No evidence of arthropathy or other focal bone abnormality. Soft tissues are unremarkable. IMPRESSION: Negative. Electronically Signed   By: Audie Pinto  M.D.   On: 02/20/2022 17:47    Procedures Procedures (including critical care time)  Medications Ordered in UC Medications - No data to display  Initial Impression / Assessment and Plan / UC Course  I have reviewed the triage vital signs and the nursing notes.  Pertinent labs & imaging results that were available during my care of the patient were reviewed by me and considered in my medical decision making (see chart for details).   57 year old female with history of chronic back pain presents for pain of the left medial knee, left posterior thigh, left buttocks and left lower back.  She states she has most pain of the medial knee.  Denies injury.  Has tried NSAIDs, prednisone, baclofen, Tylenol, stretches.  She says only thing that helps the stretches, massage and prednisone.  She did have 20 mg tablets of prednisone which she took over the past couple days and states that it helped.  An x-ray of the knee performed today is normal.  Discussed results with patient.  Exam and clinical history consistent with her pain being related to lumbar radiculopathy/underlying chronic back condition.  Sent prednisone taper and tizanidine.  Advise she can also take Tylenol.  Advised she may continue with massage.  Should her the piriformis stretch to start performing.  Also advised muscle rubs, heat, ice, etc.  Advised her to follow-up with Ortho if no improvement over the next week.  ED precautions discussed.   Final Clinical Impressions(s) / UC Diagnoses   Final diagnoses:  Acute pain of left knee  Chronic left-sided low back pain with left-sided sciatica     Discharge Instructions      BACK PAIN: Stressed avoiding painful activities . RICE (REST, ICE, COMPRESSION, ELEVATION) guidelines reviewed. May alternate ice and heat. Consider use of muscle rubs, Salonpas patches, etc. Use medications as directed including muscle relaxers if prescribed. Take anti-inflammatory medications as prescribed or OTC  NSAIDs/Tylenol.  F/u with PCP in 7-10 days for reexamination, and please feel free to call or return to the urgent care at any time for any questions or concerns you may have and we will be happy to help you!   BACK PAIN RED FLAGS: If the back pain acutely worsens or there are any red flag symptoms such as numbness/tingling, leg weakness, saddle anesthesia, or loss of bowel/bladder control, go immediately to the ER. Follow up with [place] as scheduled or sooner if the pain does not begin to resolve or if it worsens before the follow up       ED Prescriptions     Medication Sig Dispense Auth. Provider   tiZANidine (ZANAFLEX) 4 MG tablet Take 1 tablet (4 mg total) by mouth every 8 (eight) hours as needed for up to 7 days. 15 tablet Laurene Footman B, PA-C   predniSONE (DELTASONE) 10 MG tablet Take 6 tabs p.o. on day 1 and use 1 tablet daily until complete 21 tablet Danton Clap, PA-C      I have reviewed the PDMP during this encounter.   Danton Clap, PA-C 02/20/22 Jennifer Phillips
